# Patient Record
Sex: Female | Born: 1985 | Race: Black or African American | Hispanic: No | Marital: Single | Smoking: Never smoker
Health system: Southern US, Community
[De-identification: ages and names within clinical notes are randomized; demographics above are authoritative.]

---

## 2009-10-14 ENCOUNTER — Emergency Department (HOSPITAL_COMMUNITY): Admission: EM | Admit: 2009-10-14 | Discharge: 2009-10-15 | Payer: Self-pay | Admitting: Emergency Medicine
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
## 2016-02-11 ENCOUNTER — Ambulatory Visit: Payer: Self-pay

## 2018-12-17 ENCOUNTER — Other Ambulatory Visit: Payer: Self-pay

## 2018-12-17 DIAGNOSIS — Z20822 Contact with and (suspected) exposure to covid-19: Secondary | ICD-10-CM

## 2018-12-18 ENCOUNTER — Telehealth: Payer: Self-pay | Admitting: General Practice

## 2018-12-18 LAB — NOVEL CORONAVIRUS, NAA: SARS-CoV-2, NAA: DETECTED — AB

## 2018-12-18 NOTE — Telephone Encounter (Signed)
Patient called community line regarding her covid test results, she got her results herself on mychart and was confused on what abnormal meant I let her know that it was positive and told patient a nurse will give her a call back asap for any questions. Call back [phone]

## 2018-12-18 NOTE — Telephone Encounter (Signed)
Pt returned call regarding positive covid-19 test result. She voiced understanding. She denies having symptoms. Stated that she was exposed to someone about 5 days ago. She is advised to quarantine for 10 days. Advised that it is possible to have the virus and not have symptoms. Her partner tested negative for the virus.  She is asking whether it could be a false positive since she has no symptoms. Advised her to speak with her provider.  Advised patient that the health department will be calling her regarding her results.

## 2018-12-18 NOTE — Telephone Encounter (Signed)
Tried to call patient back no answer and vm was left for patient to callback.

## 2018-12-24 ENCOUNTER — Other Ambulatory Visit: Payer: Self-pay

## 2018-12-24 DIAGNOSIS — Z20822 Contact with and (suspected) exposure to covid-19: Secondary | ICD-10-CM

## 2018-12-25 ENCOUNTER — Other Ambulatory Visit: Payer: Self-pay

## 2018-12-25 DIAGNOSIS — Z20822 Contact with and (suspected) exposure to covid-19: Secondary | ICD-10-CM

## 2018-12-25 LAB — NOVEL CORONAVIRUS, NAA: SARS-CoV-2, NAA: NOT DETECTED

## 2018-12-27 LAB — NOVEL CORONAVIRUS, NAA: SARS-CoV-2, NAA: DETECTED — AB

## 2020-07-01 ENCOUNTER — Other Ambulatory Visit: Payer: Self-pay

## 2020-07-01 ENCOUNTER — Emergency Department (HOSPITAL_COMMUNITY): Payer: BC Managed Care – PPO

## 2020-07-01 ENCOUNTER — Emergency Department (HOSPITAL_COMMUNITY)
Admission: EM | Admit: 2020-07-01 | Discharge: 2020-07-02 | Disposition: A | Discharge status: Home / Self Care | Payer: BC Managed Care – PPO | Attending: Emergency Medicine | Admitting: Emergency Medicine

## 2020-07-01 DIAGNOSIS — R202 Paresthesia of skin: Secondary | ICD-10-CM | POA: Diagnosis present

## 2020-07-01 DIAGNOSIS — E538 Deficiency of other specified B group vitamins: Secondary | ICD-10-CM | POA: Diagnosis not present

## 2020-07-01 LAB — CBC WITH DIFFERENTIAL/PLATELET
Abs Immature Granulocytes: 0.01 10*3/uL (ref 0.00–0.07)
Basophils Absolute: 0 10*3/uL (ref 0.0–0.1)
Basophils Relative: 1 %
Eosinophils Absolute: 0.3 10*3/uL (ref 0.0–0.5)
Eosinophils Relative: 6 %
HCT: 37.8 % (ref 36.0–46.0)
Hemoglobin: 12.5 g/dL (ref 12.0–15.0)
Immature Granulocytes: 0 %
Lymphocytes Relative: 32 %
Lymphs Abs: 1.5 10*3/uL (ref 0.7–4.0)
MCH: 31.5 pg (ref 26.0–34.0)
MCHC: 33.1 g/dL (ref 30.0–36.0)
MCV: 95.2 fL (ref 80.0–100.0)
Monocytes Absolute: 0.5 10*3/uL (ref 0.1–1.0)
Monocytes Relative: 10 %
Neutro Abs: 2.4 10*3/uL (ref 1.7–7.7)
Neutrophils Relative %: 51 %
Platelets: 520 10*3/uL — ABNORMAL HIGH (ref 150–400)
RBC: 3.97 MIL/uL (ref 3.87–5.11)
RDW: 13.1 % (ref 11.5–15.5)
WBC: 4.7 10*3/uL (ref 4.0–10.5)
nRBC: 0 % (ref 0.0–0.2)

## 2020-07-01 LAB — BASIC METABOLIC PANEL
Anion gap: 8 (ref 5–15)
BUN: 18 mg/dL (ref 6–20)
CO2: 27 mmol/L (ref 22–32)
Calcium: 9.1 mg/dL (ref 8.9–10.3)
Chloride: 103 mmol/L (ref 98–111)
Creatinine, Ser: 0.86 mg/dL (ref 0.44–1.00)
GFR, Estimated: >60 mL/min (ref >60)
Glucose, Bld: 103 mg/dL — ABNORMAL HIGH (ref 70–99)
Potassium: 4 mmol/L (ref 3.5–5.1)
Sodium: 138 mmol/L (ref 135–145)

## 2020-07-01 LAB — VITAMIN B12: Vitamin B-12: 91 pg/mL — ABNORMAL LOW (ref 180–914)

## 2020-07-01 LAB — FOLATE: Folate: 14.4 ng/mL (ref >5.9)

## 2020-07-01 IMAGING — MR MR THORACIC SPINE W/O CM
5 of 6 series · 24 of 48 positions shown · non-contrast
Comparison: None.

CLINICAL DATA: Diffuse numbness.

EXAM:
MRI THORACIC SPINE WITHOUT CONTRAST
TECHNIQUE: Multiplanar, multisequence MR imaging of the thoracic spine was
performed. No intravenous contrast was administered.

[Series 14: T1 · sagittal · 6.0mm · 1.23mm/px · 2 of 9 slices shown (1 of 2)]
[im 1/9]
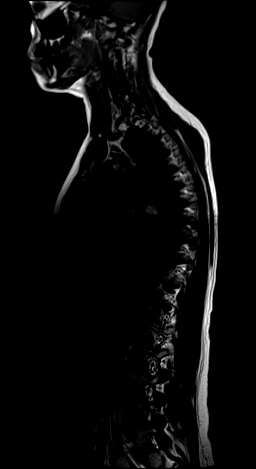
[im 9/9]
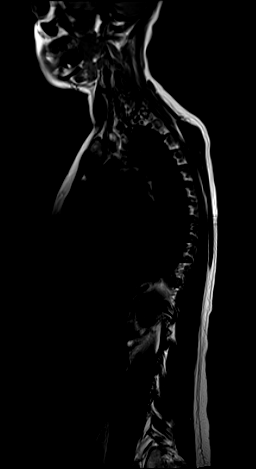

[Series 15: T2 · sagittal · 3.0mm · 0.76mm/px · 6 of 17 slices shown (1 of 2)]
[im 1/17]
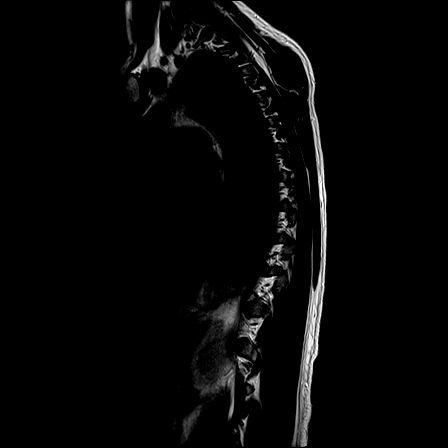
[im 4/17]
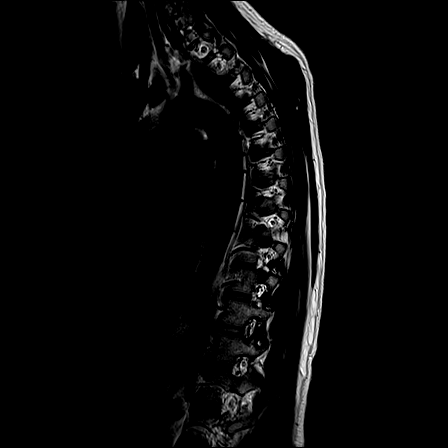
[im 7/17]
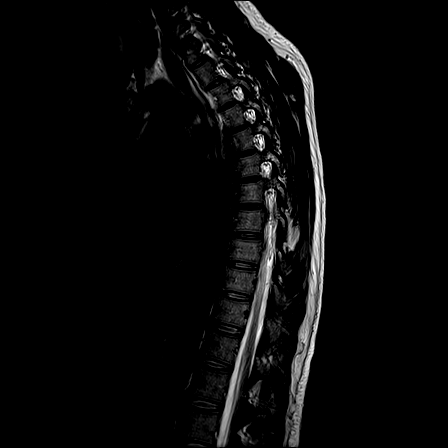
[im 10/17]
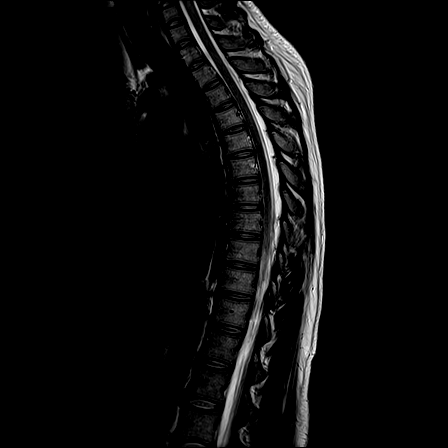
[im 13/17]
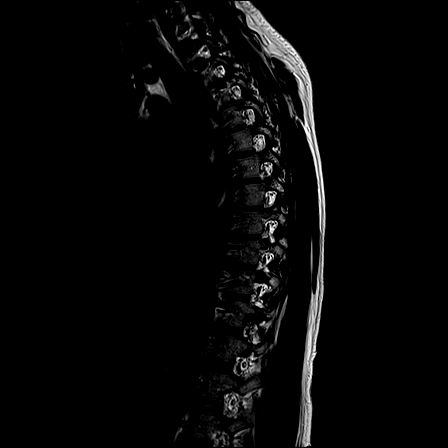
[im 17/17]
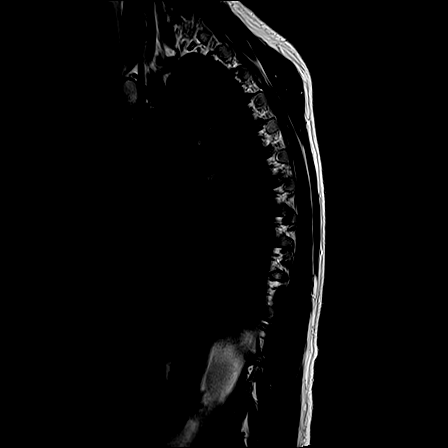

[Series 16: T1 · sagittal · 3.0mm · 0.76mm/px · 6 of 17 slices shown (2 of 2)]
[im 1/17]
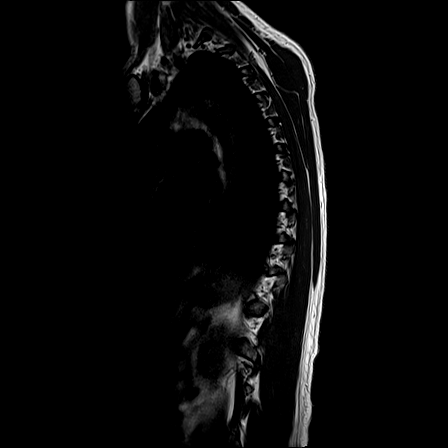
[im 4/17]
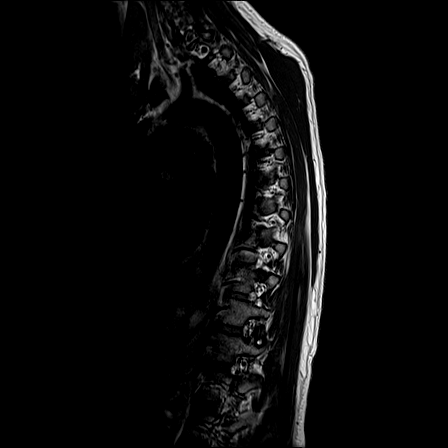
[im 7/17]
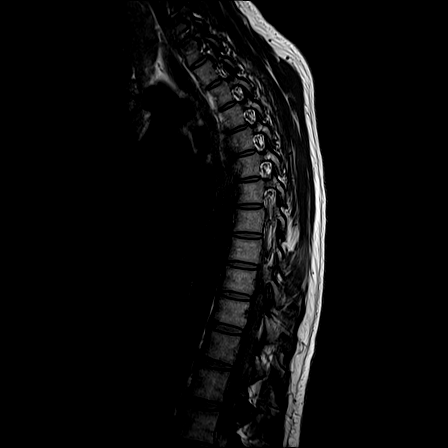
[im 10/17]
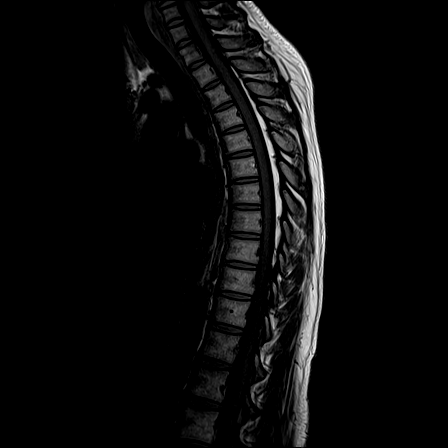
[im 13/17]
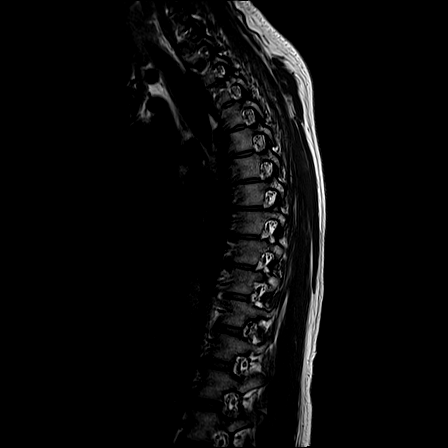
[im 17/17]
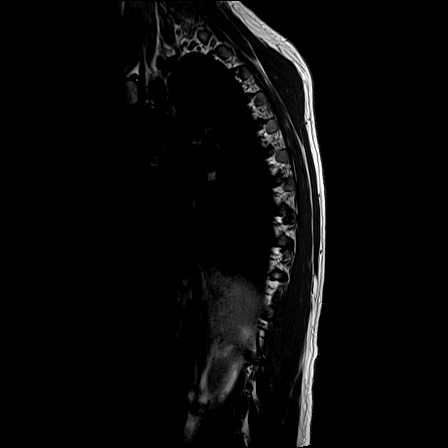

[Series 17: STIR · sagittal · 3.0mm · 0.38mm/px · 2 of 17 slices shown]
[im 1/17]
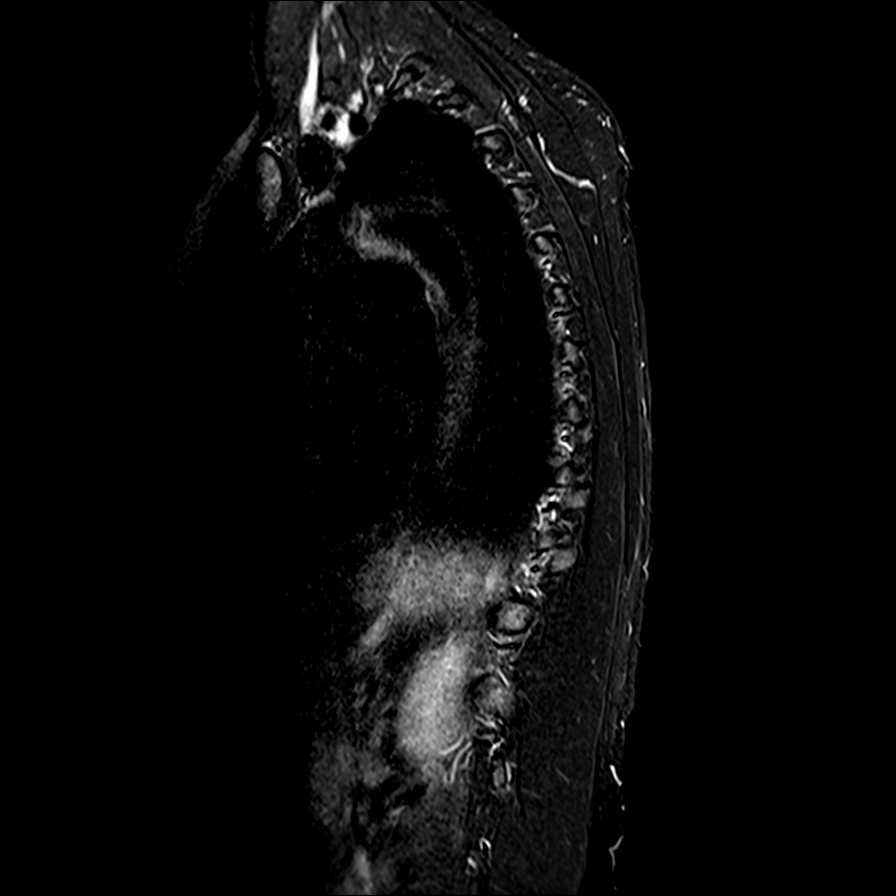
[im 4/17]
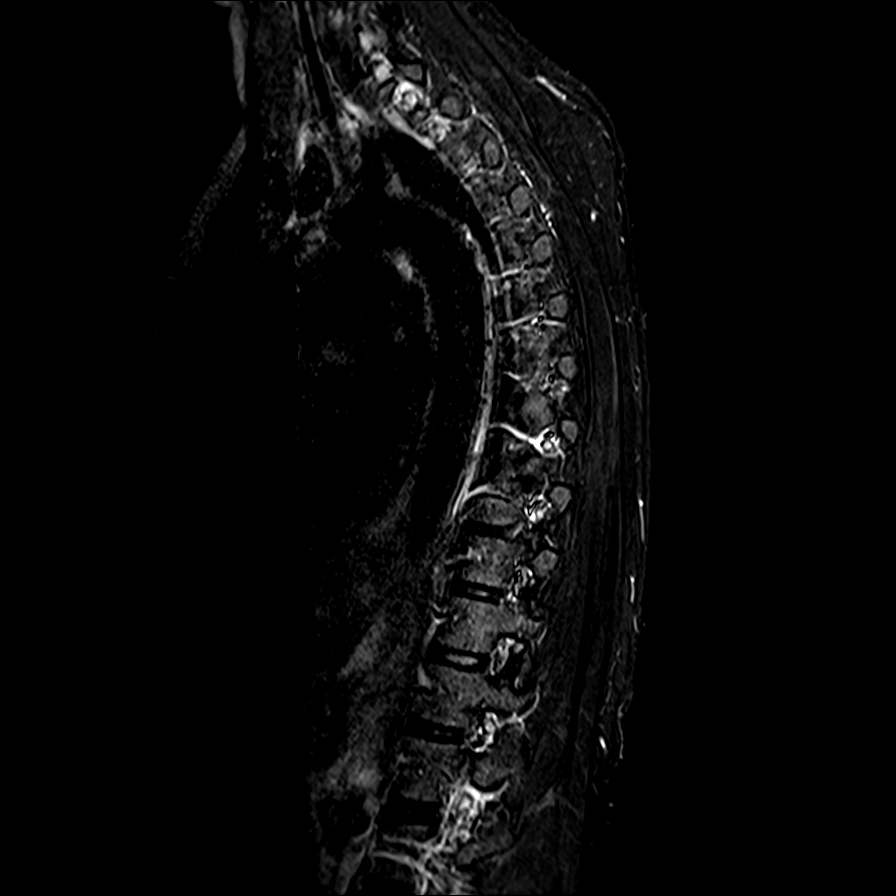

[Series 18: T2 · axial · 4.0mm · 0.59mm/px · z∈[-269,-64]mm · 8 of 39 slices shown (2 of 2)]
[im 1/39]
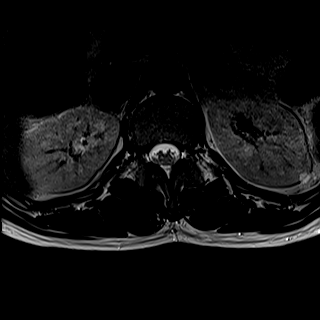
[im 6/39]
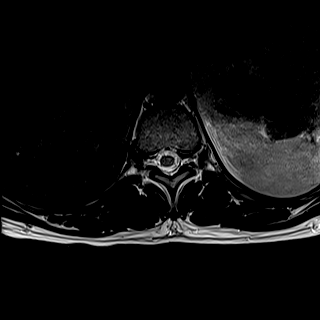
[im 12/39]
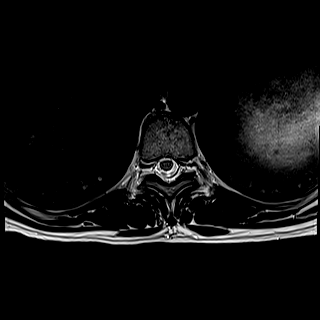
[im 18/39]
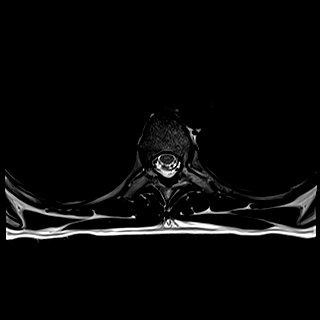
[im 21/39]
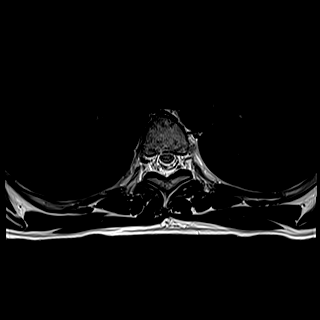
[im 27/39]
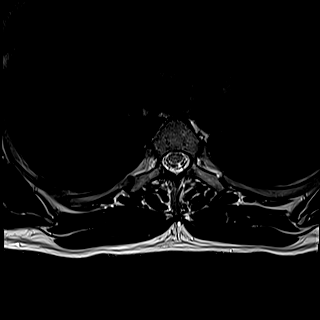
[im 33/39]
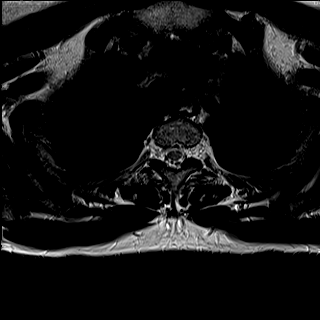
[im 39/39]
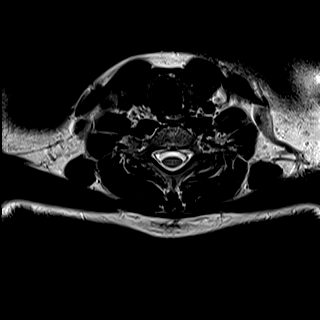

[24 of 48 positions shown; findings below may reference images not displayed]

FINDINGS: Alignment:  Physiologic.

Vertebrae: No fracture, evidence of discitis, or bone lesion.

Cord:  Normal signal and morphology.

Paraspinal and other soft tissues: Negative.

Disc levels:

No disc herniation or stenosis.
IMPRESSION: Normal thoracic spine MRI.

## 2020-07-01 IMAGING — MR MR HEAD W/O CM
10 of 12 series · 42 of 48 positions shown · non-contrast
Comparison: None.

CLINICAL DATA: Diffuse numbness.

EXAM:
MRI HEAD WITHOUT CONTRAST
TECHNIQUE: Multiplanar, multiecho pulse sequences of the brain and surrounding
structures were obtained without intravenous contrast.

[Series 5: DWI · axial · 3.0mm · 0.88mm/px · z∈[-62,+83]mm · 9 of 100 slices shown (1 of 4)]
[im 1/100]
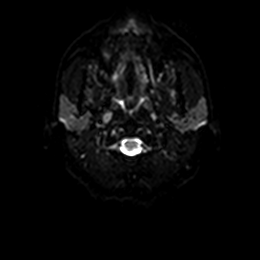
[im 13/100]
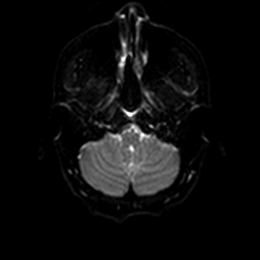
[im 25/100]
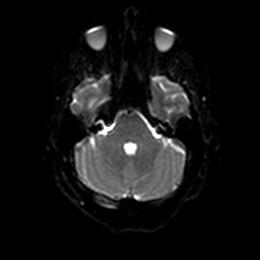
[im 38/100]
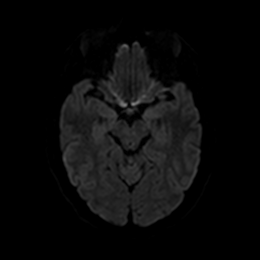
[im 50/100]
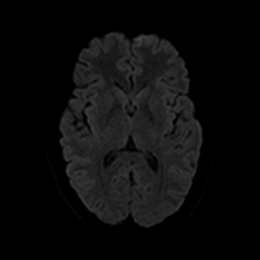
[im 62/100]
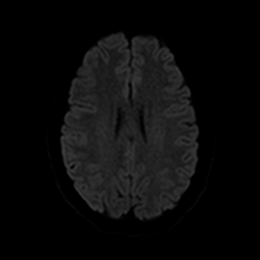
[im 75/100]
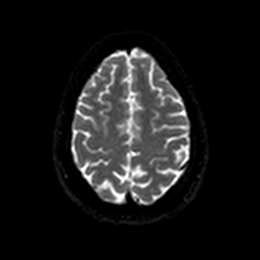
[im 87/100]
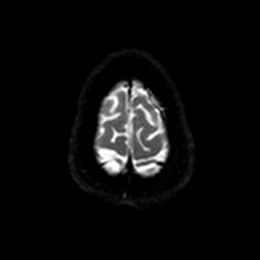
[im 100/100]
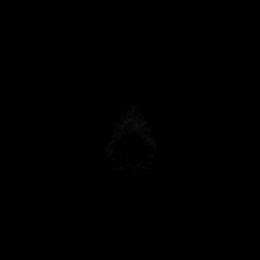

[Series 6: DWI · axial · 3.0mm · 0.88mm/px · z∈[-62,+83]mm · 5 of 50 slices shown (2 of 4)]
[im 1/50]
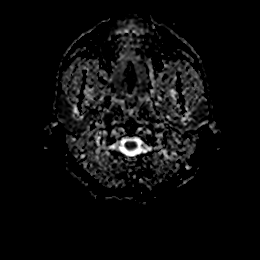
[im 13/50]
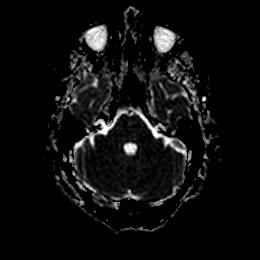
[im 25/50]
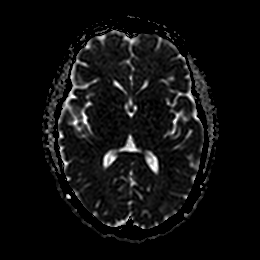
[im 37/50]
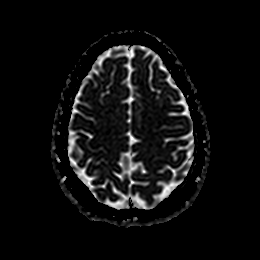
[im 50/50]
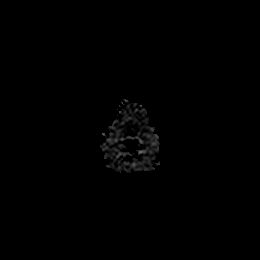

[Series 7: DWI · coronal · 4.0mm · 0.88mm/px · 6 of 66 slices shown (3 of 4)]
[im 1/66]
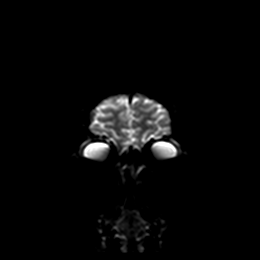
[im 14/66]
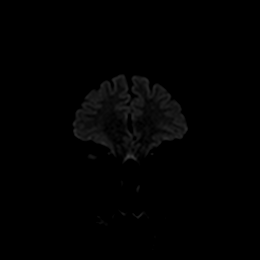
[im 27/66]
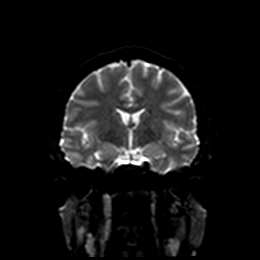
[im 40/66]
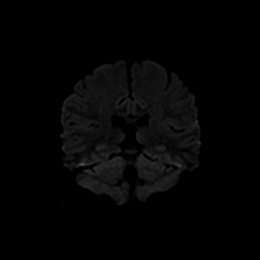
[im 53/66]
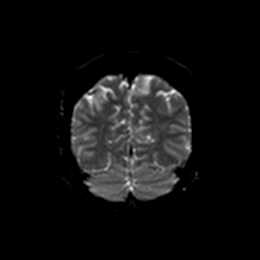
[im 66/66]
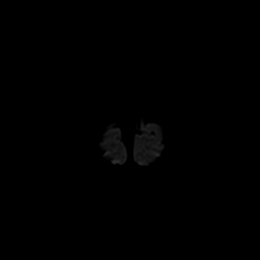

[Series 8: DWI · coronal · 4.0mm · 0.88mm/px · 3 of 33 slices shown (4 of 4)]
[im 1/33]
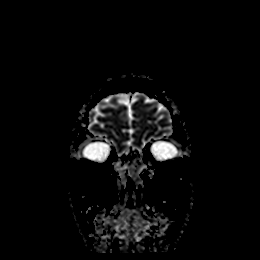
[im 17/33]
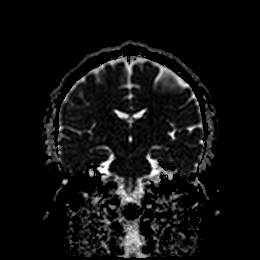
[im 33/33]
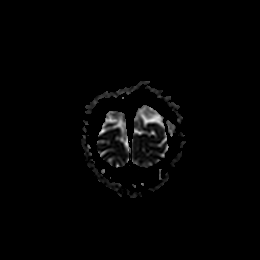

[Series 9: T1 · sagittal · 5.0mm · 0.75mm/px · 2 of 23 slices shown]
[im 1/23]
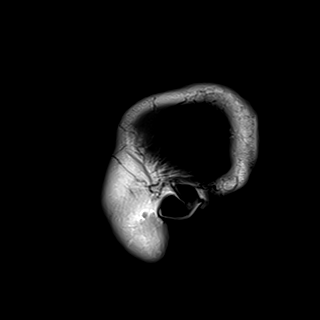
[im 23/23]
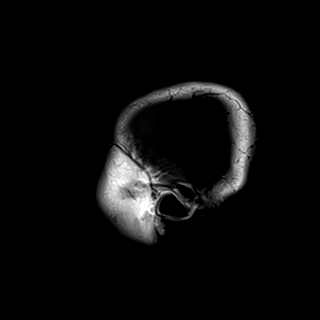

[Series 10: T2 · axial · 5.0mm · 0.72mm/px · z∈[-60,+81]mm · 2 of 25 slices shown (1 of 2)]
[im 1/25]
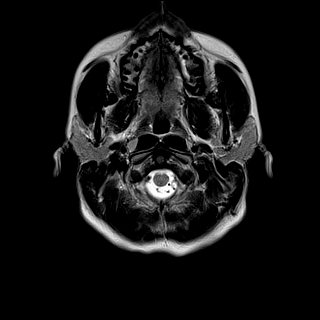
[im 25/25]
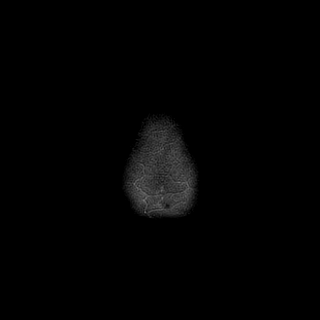

[Series 11: FLAIR · axial · 5.0mm · 0.45mm/px · z∈[-60,+81]mm · 2 of 25 slices shown]
[im 1/25]
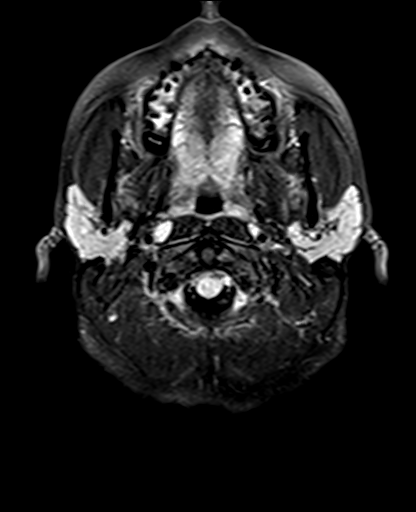
[im 25/25]
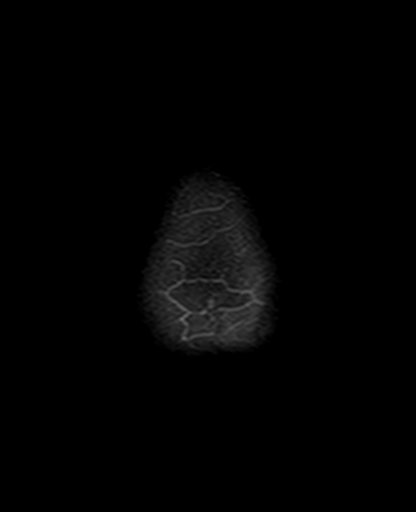

[Series 13: pha_images · axial · 3.0mm · 0.90mm/px · z∈[-77,+95]mm · 5 of 57 slices shown]
[im 1/57]
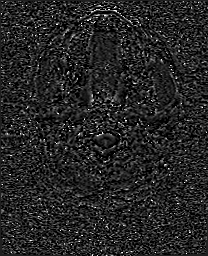
[im 15/57]
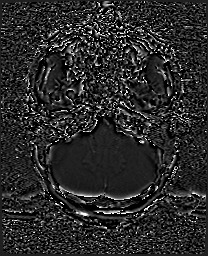
[im 29/57]
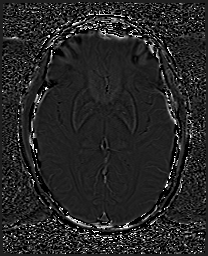
[im 43/57]
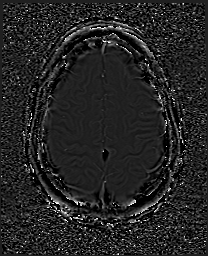
[im 57/57]
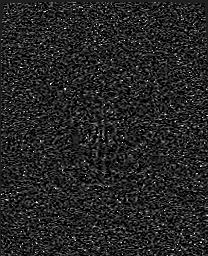

[Series 14: swi_images · axial · 3.0mm · 0.90mm/px · z∈[-77,+98]mm · 5 of 60 slices shown]
[im 1/60]
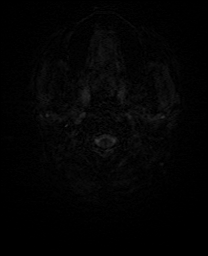
[im 15/60]
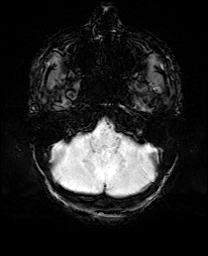
[im 30/60]
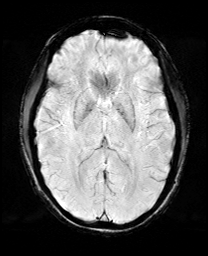
[im 45/60]
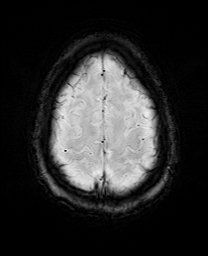
[im 60/60]
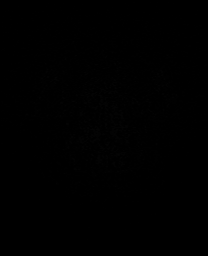

[Series 17: T2 · coronal · 5.0mm · 0.34mm/px · 3 of 29 slices shown (2 of 2)]
[im 1/29]
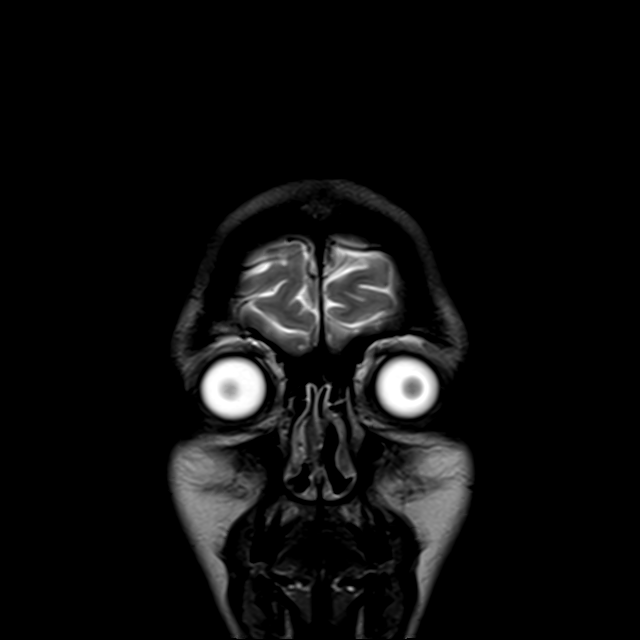
[im 15/29]
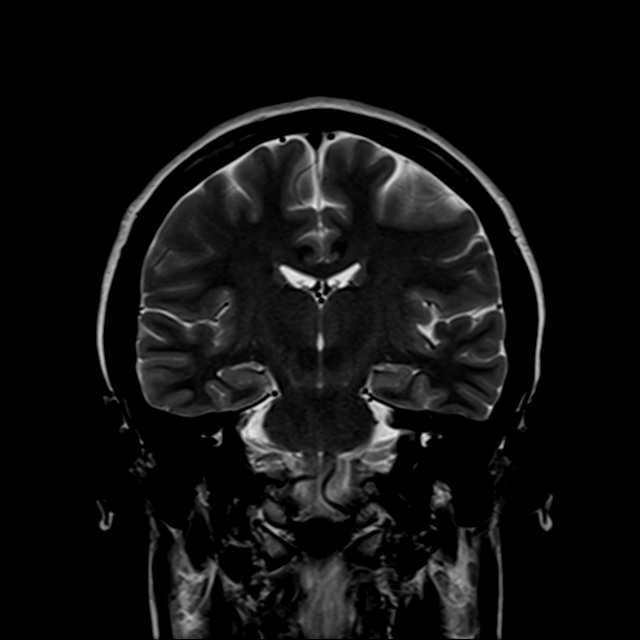
[im 29/29]
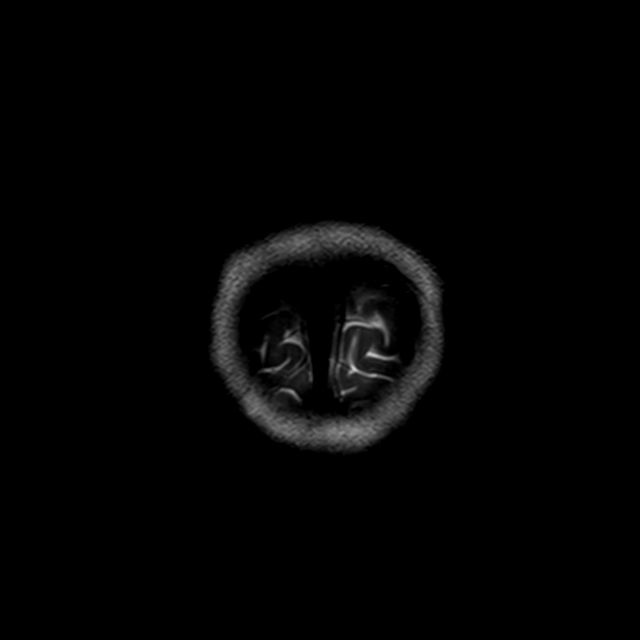

[42 of 48 positions shown; findings below may reference images not displayed]

FINDINGS: Brain: No acute infarct, mass effect or extra-axial collection. No
acute or chronic hemorrhage. Normal white matter signal, parenchymal
volume and CSF spaces. The midline structures are normal.

Vascular: Major flow voids are preserved.

Skull and upper cervical spine: Normal calvarium and skull base.
Visualized upper cervical spine and soft tissues are normal.

Sinuses/Orbits:No paranasal sinus fluid levels or advanced mucosal
thickening. No mastoid or middle ear effusion. Normal orbits.
IMPRESSION: Normal brain MRI.

## 2020-07-01 MED ORDER — THIAMINE HCL 100 MG/ML IJ SOLN
100.0000 mg | Freq: Once | INTRAMUSCULAR | Status: AC
  Administered 2020-07-01: 100 mg via INTRAVENOUS
  Filled 2020-07-01: qty 2

## 2020-07-01 MED ORDER — FOLIC ACID 1 MG PO TABS
1.0000 mg | ORAL_TABLET | Freq: Once | ORAL | Status: AC
  Administered 2020-07-01: 1 mg via ORAL
  Filled 2020-07-01: qty 1

## 2020-07-01 MED ORDER — VITAMIN B-12 1000 MCG PO TABS
500.0000 ug | ORAL_TABLET | Freq: Every day | ORAL | Status: DC
  Administered 2020-07-01: 500 ug via ORAL
  Filled 2020-07-01: qty 1

## 2020-07-01 MED ORDER — VITAMIN B-12 1000 MCG PO TABS: 500.0000 ug | ORAL_TABLET | Freq: Every day | ORAL | Status: DC

## 2020-07-01 NOTE — ED Provider Notes (Signed)
MSE was initiated and I personally evaluated the patient and placed orders (if any) at  8:03 PM on July 01, 2020.  The patient appears stable so that the remainder of the MSE may be completed by another provider.   Concepcion Living 07/01/20 2003    Sabas Sous, MD 07/02/20 [phone]

## 2020-07-01 NOTE — ED Notes (Signed)
Pt returned from MRI and placed back on monitor.  

## 2020-07-01 NOTE — ED Triage Notes (Deleted)
Pt reports this morning was looking at her phone and states she had an hard time seeing things on her phone and  Difficulty talking. Pt states called her neurologist due to hx seizure which pt reports previous s/s occur when pt having an seizure and MD wanted pt evaluated for stroke. Pt states after taking Keppra her s/s resolved.   NIH=0

## 2020-07-01 NOTE — ED Notes (Signed)
Pt transported to MRI 
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
## 2020-07-01 NOTE — Discharge Instructions (Addendum)
Your B12 was low today.  Please start taking over-the-counter oral B12 as directed.  Please follow-up with a primary care doctor to have your B12 levels rechecked.  Please stop using cocaine and laughing gas.  Your thiamine levels are still pending, you will be able to follow-up on these via the MyChart app.  There are instructions on your discharge paperwork on how to download this.  Return to the ER for any new or worsening symptoms.

## 2020-07-01 NOTE — ED Triage Notes (Signed)
Pt reports numbness in entire body and states it started in her hands an couple months ago and pt states it would be intermittent. Pt states she think she need "b-12" due to her using "laughing gas" and also cocaine use last use 5 days ago. Pt states her friend told her chronic laughing gas can cause numbness in hands

## 2020-07-02 NOTE — ED Notes (Signed)
Patient verbalizes understanding of discharge instructions. Opportunity for questioning and answers were provided. Armband removed by staff, pt discharged from ED ambulatory.   

## 2020-07-12 LAB — VITAMIN B1: Vitamin B1 (Thiamine): 152.9 nmol/L (ref 66.5–200.0)

## 2022-03-20 ENCOUNTER — Emergency Department (HOSPITAL_BASED_OUTPATIENT_CLINIC_OR_DEPARTMENT_OTHER)
Admission: EM | Admit: 2022-03-20 | Discharge: 2022-03-20 | Disposition: A | Discharge status: Home / Self Care | Payer: 59 | Attending: Emergency Medicine | Admitting: Emergency Medicine

## 2022-03-20 ENCOUNTER — Other Ambulatory Visit: Payer: Self-pay

## 2022-03-20 DIAGNOSIS — H6691 Otitis media, unspecified, right ear: Secondary | ICD-10-CM | POA: Insufficient documentation

## 2022-03-20 DIAGNOSIS — H6690 Otitis media, unspecified, unspecified ear: Secondary | ICD-10-CM

## 2022-03-20 DIAGNOSIS — H9201 Otalgia, right ear: Secondary | ICD-10-CM | POA: Diagnosis present

## 2022-03-20 MED ORDER — AMOXICILLIN-POT CLAVULANATE 875-125 MG PO TABS: 1.0000 | ORAL_TABLET | Freq: Two times a day (BID) | ORAL | 0 refills | Status: DC

## 2022-03-20 NOTE — ED Triage Notes (Signed)
Patient arrives with complaints of worsening right ear pain x1 day. Pain radiates down to her tooth. Rates pain a 4/10. No fever or chills.

## 2022-03-20 NOTE — Discharge Instructions (Addendum)
It was a pleasure taking care of you today!   You be sent a prescription for Augmentin, take as directed.  You may follow with your primary care provider regarding today's ED visit.  Attached is information for the on-call ENT specialist, you may call and set up a follow-up appointment as needed.  For your pain at home you may take over-the-counter 500 mg Tylenol every 6 hours and alternate with 600 mg ibuprofen every 6 hours as needed for pain for 7 days.  return to the ed if you are experiencing increasing/worsening symptoms.

## 2022-03-20 NOTE — ED Notes (Signed)
Patient given discharge instructions. Questions were answered. Patient verbalized understanding of discharge instructions and care at home.  

## 2022-03-20 NOTE — ED Provider Notes (Signed)
[facility] EMERGENCY DEPT Provider Note   CSN: [id number] Arrival date & time: 03/20/22  8250     History  Chief Complaint  Patient presents with   Ear Pain    Right pain    Karla Morris is a 36 y.o. female who presents emergency department with concerns for right ear pain onset yesterday.  Notes that she also has right dental pain due to the ear pain radiating to her teeth.  Has tried over-the-counter remedies for her symptoms.  Denies drainage, fever, trouble swallowing.  The history is provided by the patient. No language interpreter was used.       Home Medications Prior to Admission medications   Medication Sig Start Date End Date Taking? Authorizing Provider  amoxicillin-clavulanate (AUGMENTIN) 875-125 MG tablet Take 1 tablet by mouth every 12 (twelve) hours. 03/20/22  Yes Taneshia Lorence A, PA-C      Allergies    Patient has no known allergies.    Review of Systems   Review of Systems  All other systems reviewed and are negative.   Physical Exam Updated Vital Signs BP 126/80 (BP Location: Right Arm)   Pulse (!) 103   Temp 98.4 F (36.9 C)   Resp 16   Ht 5\' 7"  (1.702 m)   Wt 68 kg   SpO2 100%   BMI 23.48 kg/m  Physical Exam Vitals and nursing note reviewed.  Constitutional:      General: She is not in acute distress.    Appearance: Normal appearance.  HENT:     Right Ear: External ear normal. No decreased hearing noted. No drainage, swelling or tenderness. There is no impacted cerumen. No mastoid tenderness. Tympanic membrane is perforated and erythematous. Tympanic membrane is not bulging.     Left Ear: Tympanic membrane, ear canal and external ear normal. No decreased hearing noted. No drainage, swelling or tenderness. There is no impacted cerumen. No mastoid tenderness.     Ears:     Comments: Perforated TM noted on the right.  Right TM mildly erythematous. No bulging noted to the TM.  No tenderness to palpation noted to external ear.   No drainage appreciated.  No swelling noted.  No impacted cerumen no foreign body noted.  No tenderness to palpation noted to bilateral mastoids.    Mouth/Throat:     Mouth: Mucous membranes are moist.     Pharynx: Oropharynx is clear. Uvula midline.     Tonsils: No tonsillar exudate.     Comments: Uvula midline without swelling. No posterior pharyngeal erythema or tonsillar exudate noted. Patent airway. Pt able to speak in clear complete sentences. Tolerating oral secretions Eyes:     General: No scleral icterus.    Extraocular Movements: Extraocular movements intact.  Cardiovascular:     Rate and Rhythm: Normal rate.  Pulmonary:     Effort: Pulmonary effort is normal. No respiratory distress.  Abdominal:     Palpations: Abdomen is soft. There is no mass.     Tenderness: There is no abdominal tenderness.  Musculoskeletal:        General: Normal range of motion.     Cervical back: Neck supple.  Skin:    General: Skin is warm and dry.     Findings: No rash.  Neurological:     Mental Status: She is alert.     Sensory: Sensation is intact.     Motor: Motor function is intact.  Psychiatric:        Behavior: Behavior normal.  ED Results / Procedures / Treatments   Labs (all labs ordered are listed, but only abnormal results are displayed) Labs Reviewed - No data to display  EKG None  Radiology No results found.  Procedures Procedures    Medications Ordered in ED Medications - No data to display  ED Course/ Medical Decision Making/ A&P                           Medical Decision Making  Patient presents with right ear pain onset yesterday.  Has tried over-the-counter remedies for her symptoms.  Patient afebrile.  On exam patient with Uvula midline without swelling. No posterior pharyngeal erythema or tonsillar exudate noted. Patent airway. Pt able to speak in clear complete sentences. Tolerating oral secretions. Perforated TM noted on the right.  Right TM mildly  erythematous. No bulging noted to the TM.  No tenderness to palpation noted to external ear.  No drainage appreciated.  No swelling noted.  No impacted cerumen no foreign body noted.  No tenderness to palpation noted to bilateral mastoids. Differential diagnosis includes otitis media, otitis externa, acute mastoiditis, meningitis.    Disposition: Patient presentation suspicious for otitis media. Doubt otitis externa, acute mastoiditis, meningitis at this time. After consideration of the diagnostic results and the patients response to treatment, I feel that the patient would benefit from Discharge home.  Patient discharged home with prescription for Augmentin.  Provided with information for ENT specialist for follow-up as needed.  Advised patient to follow-up with primary care provider.  Supportive care measures and strict return precautions discussed with patient at bedside. Pt acknowledges and verbalizes understanding. Pt appears safe for discharge. Follow up as indicated in discharge paperwork.    This chart was dictated using voice recognition software, Dragon. Despite the best efforts of this provider to proofread and correct errors, errors may still occur which can change documentation meaning.   Final Clinical Impression(s) / ED Diagnoses Final diagnoses:  Acute otitis media, unspecified otitis media type    Rx / DC Orders ED Discharge Orders          Ordered    amoxicillin-clavulanate (AUGMENTIN) 875-125 MG tablet  Every 12 hours        03/20/22 1153              Ellizabeth Dacruz A, PA-C 03/20/22 1156    Mardene Sayer, MD 03/20/22 1521

## 2022-07-25 ENCOUNTER — Emergency Department (HOSPITAL_BASED_OUTPATIENT_CLINIC_OR_DEPARTMENT_OTHER)
Admission: EM | Admit: 2022-07-25 | Discharge: 2022-07-25 | Disposition: A | Discharge status: Home / Self Care | Payer: 59 | Attending: Emergency Medicine | Admitting: Emergency Medicine

## 2022-07-25 ENCOUNTER — Other Ambulatory Visit: Payer: Self-pay

## 2022-07-25 ENCOUNTER — Encounter (HOSPITAL_BASED_OUTPATIENT_CLINIC_OR_DEPARTMENT_OTHER): Payer: Self-pay | Admitting: Emergency Medicine

## 2022-07-25 DIAGNOSIS — H66001 Acute suppurative otitis media without spontaneous rupture of ear drum, right ear: Secondary | ICD-10-CM | POA: Diagnosis not present

## 2022-07-25 DIAGNOSIS — H9201 Otalgia, right ear: Secondary | ICD-10-CM | POA: Diagnosis present

## 2022-07-25 MED ORDER — FLUCONAZOLE 150 MG PO TABS: ORAL_TABLET | ORAL | 0 refills | Status: AC

## 2022-07-25 MED ORDER — OXYCODONE-ACETAMINOPHEN 5-325 MG PO TABS
1.0000 | ORAL_TABLET | ORAL | Status: DC | PRN
  Administered 2022-07-25: 1 via ORAL
  Filled 2022-07-25: qty 1

## 2022-07-25 MED ORDER — AMOXICILLIN 500 MG PO CAPS
1000.0000 mg | ORAL_CAPSULE | Freq: Once | ORAL | Status: AC
  Administered 2022-07-25: 1000 mg via ORAL
  Filled 2022-07-25: qty 2

## 2022-07-25 MED ORDER — KETOROLAC TROMETHAMINE 30 MG/ML IJ SOLN
30.0000 mg | Freq: Once | INTRAMUSCULAR | Status: AC
  Administered 2022-07-25: 30 mg via INTRAMUSCULAR
  Filled 2022-07-25: qty 1

## 2022-07-25 MED ORDER — AMOXICILLIN 500 MG PO CAPS: 1000.0000 mg | ORAL_CAPSULE | Freq: Three times a day (TID) | ORAL | 0 refills | Status: AC

## 2022-07-25 MED ORDER — HYDROCODONE-ACETAMINOPHEN 5-325 MG PO TABS: 1.0000 | ORAL_TABLET | Freq: Four times a day (QID) | ORAL | 0 refills | Status: AC | PRN

## 2022-07-25 NOTE — ED Triage Notes (Signed)
Right ear pain Started earlier today. Denies illness or drainage, took Advil about 8 hours ago.

## 2022-07-25 NOTE — ED Provider Notes (Signed)
[facility] Provider Note: Lowella Dell, MD, FACEP  CSN: [id number] MRN: [id number] ARRIVAL: 07/25/22 at 0253 ROOM: [facility]   CHIEF COMPLAINT  Ear Pain   HISTORY OF PRESENT ILLNESS  07/25/22 4:11 AM Karla Morris is a 37 y.o. female with right ear pain that began yesterday.  She took ibuprofen about 8 hours prior to arrival.  She rates her pain as a 10 out of 10.  There is been no drainage from the ear.  She has not been sick with a cold recently.   History reviewed. No pertinent past medical history.  History reviewed. No pertinent surgical history.  History reviewed. No pertinent family history.  Social History   Tobacco Use   Smoking status: Never   Smokeless tobacco: Never  Substance Use Topics   Alcohol use: Never   Drug use: Never    Prior to Admission medications   Medication Sig Start Date End Date Taking? Authorizing Provider  amoxicillin (AMOXIL) 500 MG capsule Take 2 capsules (1,000 mg total) by mouth 3 (three) times daily for 7 days. 07/25/22 08/01/22 Yes Sharnette Kitamura, MD  fluconazole (DIFLUCAN) 150 MG tablet Take 1 tablet as needed for vaginal yeast infection.  May repeat in 3 days if symptoms persist. 07/25/22  Yes Bartley Vuolo, Jonny Ruiz, MD  HYDROcodone-acetaminophen (NORCO) 5-325 MG tablet Take 1 tablet by mouth every 6 (six) hours as needed for severe pain. 07/25/22  Yes Vinette Crites, MD    Allergies Patient has no known allergies.   REVIEW OF SYSTEMS  Negative except as noted here or in the History of Present Illness.   PHYSICAL EXAMINATION  Initial Vital Signs Blood pressure 116/83, pulse (!) 105, temperature 98 F (36.7 C), temperature source Oral, resp. rate 20, height 5' 7.5" (1.715 m), weight 65.8 kg, SpO2 100 %.  Examination General: Well-developed, well-nourished female in no acute distress; appearance consistent with age of record HENT: normocephalic; atraumatic; left TM normal, right TM erythematous Eyes: Normal appearance Neck:  supple Heart: regular rate and rhythm Lungs: clear to auscultation bilaterally Abdomen: soft; nondistended; nontender; bowel sounds present Extremities: No deformity; full range of motion Neurologic: Awake, alert and oriented; motor function intact in all extremities and symmetric; no facial droop Skin: Diaphoretic Psychiatric: Moaning; writhing; pacing   RESULTS  Summary of this visit's results, reviewed and interpreted by myself:   EKG Interpretation  Date/Time:    Ventricular Rate:    PR Interval:    QRS Duration:   QT Interval:    QTC Calculation:   R Axis:     Text Interpretation:         Laboratory Studies: No results found for this or any previous visit (from the past 24 hour(s)). Imaging Studies: No results found.  ED COURSE and MDM  Nursing notes, initial and subsequent vitals signs, including pulse oximetry, reviewed and interpreted by myself.  Vitals:   07/25/22 0300 07/25/22 0305  BP:  116/83  Pulse:  (!) 105  Resp:  20  Temp:  98 F (36.7 C)  TempSrc:  Oral  SpO2:  100%  Weight: 65.8 kg   Height: 5' 7.5" (1.715 m)    Medications  oxyCODONE-acetaminophen (PERCOCET/ROXICET) 5-325 MG per tablet 1 tablet (1 tablet Oral Given 07/25/22 0304)  amoxicillin (AMOXIL) capsule 1,000 mg (has no administration in time range)  ketorolac (TORADOL) 30 MG/ML injection 30 mg (has no administration in time range)   The patient's right eardrum is erythematous consistent with an acute otitis media.  Her  pain seems out of proportion to physical findings.   PROCEDURES  Procedures   ED DIAGNOSES     ICD-10-CM   1. Non-recurrent acute suppurative otitis media of right ear without spontaneous rupture of tympanic membrane  H66.001          Tailer Volkert, MD 07/25/22 [phone]

## 2023-04-30 ENCOUNTER — Other Ambulatory Visit: Payer: Self-pay

## 2023-04-30 ENCOUNTER — Emergency Department (HOSPITAL_COMMUNITY)
Admission: EM | Admit: 2023-04-30 | Discharge: 2023-04-30 | Discharge status: Against Medical Advice | Payer: Medicaid Other | Attending: Emergency Medicine | Admitting: Emergency Medicine

## 2023-04-30 DIAGNOSIS — U071 COVID-19: Secondary | ICD-10-CM | POA: Insufficient documentation

## 2023-04-30 DIAGNOSIS — Z5321 Procedure and treatment not carried out due to patient leaving prior to being seen by health care provider: Secondary | ICD-10-CM | POA: Insufficient documentation

## 2023-04-30 DIAGNOSIS — R531 Weakness: Secondary | ICD-10-CM | POA: Diagnosis present

## 2023-04-30 LAB — CBC WITH DIFFERENTIAL/PLATELET
Abs Immature Granulocytes: 0.03 10*3/uL (ref 0.00–0.07)
Basophils Absolute: 0 10*3/uL (ref 0.0–0.1)
Basophils Relative: 1 %
Eosinophils Absolute: 0.2 10*3/uL (ref 0.0–0.5)
Eosinophils Relative: 5 %
HCT: 44.1 % (ref 36.0–46.0)
Hemoglobin: 14.3 g/dL (ref 12.0–15.0)
Immature Granulocytes: 1 %
Lymphocytes Relative: 36 %
Lymphs Abs: 1.6 10*3/uL (ref 0.7–4.0)
MCH: 29.9 pg (ref 26.0–34.0)
MCHC: 32.4 g/dL (ref 30.0–36.0)
MCV: 92.1 fL (ref 80.0–100.0)
Monocytes Absolute: 0.6 10*3/uL (ref 0.1–1.0)
Monocytes Relative: 12 %
Neutro Abs: 2 10*3/uL (ref 1.7–7.7)
Neutrophils Relative %: 45 %
Platelets: 391 10*3/uL (ref 150–400)
RBC: 4.79 MIL/uL (ref 3.87–5.11)
RDW: 12 % (ref 11.5–15.5)
WBC: 4.5 10*3/uL (ref 4.0–10.5)
nRBC: 0 % (ref 0.0–0.2)

## 2023-04-30 LAB — RESP PANEL BY RT-PCR (RSV, FLU A&B, COVID)  RVPGX2
Influenza A by PCR: NEGATIVE
Influenza B by PCR: NEGATIVE
Resp Syncytial Virus by PCR: NEGATIVE
SARS Coronavirus 2 by RT PCR: POSITIVE — AB

## 2023-04-30 LAB — COMPREHENSIVE METABOLIC PANEL
ALT: 39 U/L (ref 0–44)
AST: 33 U/L (ref 15–41)
Albumin: 3.3 g/dL — ABNORMAL LOW (ref 3.5–5.0)
Alkaline Phosphatase: 57 U/L (ref 38–126)
Anion gap: 10 (ref 5–15)
BUN: 14 mg/dL (ref 6–20)
CO2: 28 mmol/L (ref 22–32)
Calcium: 7.8 mg/dL — ABNORMAL LOW (ref 8.9–10.3)
Chloride: 103 mmol/L (ref 98–111)
Creatinine, Ser: 0.88 mg/dL (ref 0.44–1.00)
GFR, Estimated: >60 mL/min (ref >60)
Glucose, Bld: 92 mg/dL (ref 70–99)
Potassium: 3.7 mmol/L (ref 3.5–5.1)
Sodium: 141 mmol/L (ref 135–145)
Total Bilirubin: 0.5 mg/dL (ref 0.0–1.2)
Total Protein: 6.1 g/dL — ABNORMAL LOW (ref 6.5–8.1)

## 2023-04-30 MED ORDER — SODIUM CHLORIDE 0.9 % IV BOLUS: 1000.0000 mL | Freq: Once | INTRAVENOUS | Status: DC

## 2023-04-30 NOTE — ED Triage Notes (Signed)
Patient c/o weakness after donating plasma today. Patient Initial BP was 80/40. Patient had bolus by EMS. Patient denies N/V.

## 2023-04-30 NOTE — ED Provider Triage Note (Signed)
 Emergency Medicine Provider Triage Evaluation Note  Karla Morris , a 38 y.o. female  was evaluated in triage.  Pt complains of weakness.  Patient was donating plasma earlier today when she had a syncopal episode.  Patient was noted to be hypotensive immediately after the syncopal episode and remained hypotensive even after administration of 500 mL of fluid by EMS.  No prior history of hypertension.  Patient denies any recent vomiting or diarrhea.  No recent sick contacts.  No fever chills or bodyaches.  Review of Systems  Positive: As above Negative: As above  Physical Exam  BP (!) 72/44   Pulse 77   Temp 98 F (36.7 C)   Resp 16   Ht 5\' 7"  (1.702 m)   Wt 65 kg   SpO2 99%   BMI 22.44 kg/m  Gen:   Awake, no distress   Resp:  Normal effort  MSK:   Moves extremities without difficulty  Other:    Medical Decision Making  Medically screening exam initiated at 6:33 PM.  Appropriate orders placed.  Chrystine Frogge was informed that the remainder of the evaluation will be completed by another provider, this initial triage assessment does not replace that evaluation, and the importance of remaining in the ED until their evaluation is complete.     Smitty Knudsen, PA-C 04/30/23 [phone]

## 2023-05-09 ENCOUNTER — Telehealth: Payer: Medicaid Other | Admitting: Family

## 2023-05-09 ENCOUNTER — Telehealth: Payer: Medicaid Other

## 2023-05-09 DIAGNOSIS — U071 COVID-19: Secondary | ICD-10-CM

## 2023-05-09 MED ORDER — BENZONATATE 100 MG PO CAPS: 100.0000 mg | ORAL_CAPSULE | Freq: Three times a day (TID) | ORAL | 0 refills | Status: AC | PRN

## 2023-05-09 NOTE — Progress Notes (Signed)
E-Visit  for Positive Covid Test Result   We are sorry you are not feeling well. We are here to help!  You have tested positive for COVID-19, meaning that you were infected with the novel coronavirus and could give the virus to others.  Most people with COVID-19 have mild illness and can recover at home without medical care. Do not leave your home, except to get medical care. Do not visit public areas and do not go to places where you are unable to wear a mask. It is important that you stay home  to take care for yourself and to help protect other people in your home and community.      Isolation Instructions:   You are to isolate at home until you have been fever free for at least 24 hours without a fever-reducing medication, and symptoms have been steadily improving for 24 hours. At that time,  you can end isolation but need to mask for an additional 5 days.  If you must be around other household members who do not have symptoms, you need to make sure that both you and the family members are masking consistently with a high-quality mask.  If you note any worsening of symptoms despite treatment, please seek an in-person evaluation ASAP. If you note any significant shortness of breath or any chest pain, please seek ER evaluation. Please do not delay care!   Go to the nearest hospital ED for assessment if fever/cough/breathlessness are severe or illness seems like a threat to life.    The following symptoms may appear 2-14 days after exposure: Fever Cough Shortness of breath or difficulty breathing Chills Repeated shaking with chills Muscle pain Headache Sore throat New loss of taste or smell Fatigue Congestion or runny nose Nausea or vomiting Diarrhea  You can use medication such as prescription cough medication called Tessalon Perles 100 mg. You may take 1-2 capsules every 8 hours as needed for cough  You may also take acetaminophen (Tylenol) as needed for fever.  HOME  CARE: Only take medications as instructed by your medical team. Drink plenty of fluids and get plenty of rest. A steam or ultrasonic humidifier can help if you have congestion.   GET HELP RIGHT AWAY IF YOU HAVE EMERGENCY WARNING SIGNS.  Call 911 or proceed to your closest emergency facility if: You develop worsening high fever. Trouble breathing Bluish lips or face Persistent pain or pressure in the chest New confusion Inability to wake or stay awake You cough up blood. Your symptoms become more severe Inability to hold down food or fluids  This list is not all possible symptoms. Contact your medical provider for any symptoms that are severe or concerning to you.   Your e-visit answers were reviewed by a board certified advanced clinical practitioner to complete your personal care plan.  Depending on the condition, your plan could have included both over the counter or prescription medications.  If there is a problem please reply once you have received a response from your provider.  Your safety is important to [place].  If you have drug allergies check your prescription carefully.    You can use MyChart to ask questions about today's visit, request a non-urgent call back, or ask for a work or school excuse for 24 hours related to this e-Visit. If it has been greater than 24 hours you will need to follow up with your provider, or enter a new e-Visit to address those concerns. You will get an e-mail  in the next two days asking about your experience.  I hope that your e-visit has been valuable and will speed your recovery. Thank you for using e-visits.   Approximately 5 minutes was spent documenting and reviewing patient's chart.

## 2023-05-09 NOTE — Addendum Note (Signed)
Addended by: Jannifer Rodney A on: 05/09/2023 05:21 PM   Modules accepted: Orders, Level of Service

## 2023-05-09 NOTE — Progress Notes (Signed)
I am unsure if patient needs work note to return work from [employer]. Waiting on reply from patient.   Jannifer Rodney, FNP
